# Patient Record
Sex: Male | Born: 1945 | Race: Black or African American | Hispanic: No | Marital: Married
Health system: Southern US, Academic
[De-identification: ages and names within clinical notes are randomized; demographics above are authoritative.]

---

## 2000-03-28 ENCOUNTER — Other Ambulatory Visit (HOSPITAL_COMMUNITY): Payer: Self-pay

## 2023-03-05 ENCOUNTER — Encounter (HOSPITAL_COMMUNITY): Payer: Self-pay | Admitting: Family

## 2023-03-05 ENCOUNTER — Emergency Department (HOSPITAL_COMMUNITY): Payer: Medicare Other

## 2023-03-05 ENCOUNTER — Emergency Department
Admission: EM | Admit: 2023-03-05 | Discharge: 2023-03-05 | Disposition: A | Discharge status: Home / Self Care | Payer: Medicare Other | Attending: Emergency Medicine | Admitting: Emergency Medicine

## 2023-03-05 ENCOUNTER — Other Ambulatory Visit: Payer: Self-pay

## 2023-03-05 DIAGNOSIS — X501XXA Overexertion from prolonged static or awkward postures, initial encounter: Secondary | ICD-10-CM | POA: Insufficient documentation

## 2023-03-05 DIAGNOSIS — M25559 Pain in unspecified hip: Secondary | ICD-10-CM

## 2023-03-05 DIAGNOSIS — M25552 Pain in left hip: Secondary | ICD-10-CM | POA: Insufficient documentation

## 2023-03-05 MED ORDER — KETOROLAC 30 MG/ML (1 ML) INJECTION SOLUTION
30.0000 mg | INTRAMUSCULAR | Status: AC
Start: 2023-03-05 — End: 2023-03-05
  Administered 2023-03-05: 30 mg via INTRAMUSCULAR

## 2023-03-05 MED ORDER — DEXAMETHASONE SODIUM PHOSPHATE (PF) 10 MG/ML INJECTION SOLUTION
10.0000 mg | INTRAMUSCULAR | Status: DC
Start: 2023-03-05 — End: 2023-03-05

## 2023-03-05 MED ORDER — CYCLOBENZAPRINE 10 MG TABLET
10.0000 mg | ORAL_TABLET | Freq: Three times a day (TID) | ORAL | 0 refills | Status: DC | PRN
Start: 2023-03-05 — End: 2023-03-05

## 2023-03-05 MED ORDER — ACETAMINOPHEN 500 MG TABLET
1000.0000 mg | ORAL_TABLET | Freq: Four times a day (QID) | ORAL | 0 refills | Status: AC | PRN
Start: 2023-03-05 — End: 2023-03-12

## 2023-03-05 MED ORDER — KETOROLAC 30 MG/ML (1 ML) INJECTION SOLUTION
INTRAMUSCULAR | Status: AC
Start: 2023-03-05 — End: 2023-03-05
  Filled 2023-03-05: qty 1

## 2023-03-05 MED ORDER — PREDNISONE 50 MG TABLET
50.0000 mg | ORAL_TABLET | Freq: Every day | ORAL | 0 refills | Status: AC
Start: 2023-03-05 — End: 2023-03-10

## 2023-03-05 MED ORDER — CYCLOBENZAPRINE 10 MG TABLET
10.0000 mg | ORAL_TABLET | ORAL | Status: DC
Start: 2023-03-05 — End: 2023-03-05

## 2023-03-05 MED ORDER — ACETAMINOPHEN 500 MG TABLET
1000.0000 mg | ORAL_TABLET | Freq: Four times a day (QID) | ORAL | Status: DC | PRN
Start: 2023-03-05 — End: 2023-03-05

## 2023-03-05 MED ORDER — PREDNISONE 50 MG TABLET
50.0000 mg | ORAL_TABLET | Freq: Every day | ORAL | 0 refills | Status: DC
Start: 2023-03-05 — End: 2023-03-05

## 2023-03-05 MED ORDER — CYCLOBENZAPRINE 10 MG TABLET
10.0000 mg | ORAL_TABLET | Freq: Three times a day (TID) | ORAL | 0 refills | Status: AC | PRN
Start: 2023-03-05 — End: ?

## 2023-03-05 NOTE — ED APP Handoff Note (Signed)
[facility]  Emergency Department  Provider in Triage Note    Name: Todd BORCHERS Sr.  Age: 77 y.o.  Gender: male     Subjective:   Todd RA Sr. is a 77 y.o. male who presents with complaint of Hip Pain  .  Patient states new onset of significant left-sided hip pain after turning the wrong way yesterday.  He denies falling or direct impact.  He has had no problems with this hip before.  Patient is able to walk he states that it just makes it worse.  Patient does not take blood thinners.    Objective:   Filed Vitals:    03/05/23 1448   BP: (!) 144/87   Pulse: (!) 105   Resp: 20   Temp: 36.7 C (98.1 F)   SpO2: 100%      Focused Physical Exam shows tenderness with palpation of the left hip.  Left femur has no tenderness to palpation in lumbar spine has no tenderness with palpation.    Plan:  Please see initial orders and work-up below.  This is to be continued with full evaluation in the main Emergency Department.     ketorolac (TORADOL) 30 mg/mL injection, 30 mg, IntraMUSCULAR, Now       No results found for this or any previous visit (from the past 24 hour(s)).     Lorrin Goodell, PA-C  03/05/2023, 14:47

## 2023-03-05 NOTE — ED Provider Notes (Signed)
[facility]  ED Primary Provider Note  History of Present Illness   Chief Complaint   Patient presents with    Hip Pain     Todd Horton Sr. is a 77 y.o. male who had concerns including Hip Pain.  Arrival: The patient arrived by Car    Patient is 77 year old male to the emergency department complaining of left hip pain.  Patient states he was turning and felt a sharp pain in his left hip that radiated into his groin.  Patient denies history of hip problems in the past.  Patient denies loss of bladder, bowel, saddle anesthesia, urinary bladder retention.      History Reviewed This Encounter: Medical History  Surgical History  Family History  Social History    Physical Exam   ED Triage Vitals [03/05/23 1448]   BP (Non-Invasive) (!) 144/87   Heart Rate (!) 105   Respiratory Rate 20   Temperature 36.7 C (98.1 F)   SpO2 100 %   Weight 79.4 kg (175 lb)   Height 1.829 m (6')     Physical Exam  Vitals and nursing note reviewed.   Constitutional:       General: He is not in acute distress.     Appearance: He is well-developed.   HENT:      Head: Normocephalic and atraumatic.   Eyes:      Conjunctiva/sclera: Conjunctivae normal.   Cardiovascular:      Rate and Rhythm: Normal rate and regular rhythm.      Heart sounds: No murmur heard.  Pulmonary:      Effort: Pulmonary effort is normal. No respiratory distress.      Breath sounds: Normal breath sounds.   Abdominal:      Palpations: Abdomen is soft.      Tenderness: There is no abdominal tenderness.   Musculoskeletal:         General: No swelling.      Cervical back: Neck supple.   Skin:     General: Skin is warm and dry.      Capillary Refill: Capillary refill takes less than 2 seconds.   Neurological:      Mental Status: He is alert.   Psychiatric:         Mood and Affect: Mood normal.       Patient Data   Labs Ordered/Reviewed - No data to display  XR HIP LEFT W PELVIS 2-3 VIEWS   Final Result by Edi, Radresults In (06/20 1510)   NO  ACUTE FRACTURE OR DISLOCATION.       If acute hip fracture is suspected after a fall or minor trauma and initial radiographs are negative then MRI of the pelvis and affected hip without IV contrast or CT of the pelvis and hips without IV contrast is usually appropriate as the next imaging study. (ACR Appropriateness Criteria: Acute Hip Pain-Suspected Fracture, 2018)                Radiologist location ID: [id number]           Medical Decision Making        Medical Decision Making  Patient is 77 year old male to the emergency department complaining of left hip pain.  On physical examination patient has pain along left SI joint.  Patient DTRs and cranial nerves 2-12 intact patient is ambulatory without limp.  Patient x-ray ordered for evaluation showing no acute abnormalities.  Patient medicated with Toradol and on re-evaluation  patient feels somewhat better.  Discussed CT of hip for further evaluation patient denied at this time and agrees there was no trauma to the hip.  No palpable deformities noted.  Patient is discharged with Tylenol, Decadron and Flexeril to use at home and to follow up with primary care or return to ED if worsening symptoms.    Risk  OTC drugs.  Prescription drug management.                Medications Administered in the ED   dexAMETHasone (PF) 10 mg/mL injection (has no administration in time range)   cyclobenzaprine (FLEXERIL) tablet (has no administration in time range)   ketorolac (TORADOL) 30 mg/mL injection (30 mg IntraMUSCULAR Given 03/05/23 1500)     Clinical Impression   Hip pain, unspecified laterality (Primary)       Disposition: Discharged

## 2023-03-05 NOTE — ED Triage Notes (Signed)
Left hip started yesterday after she turned . Pain into leg

## 2023-03-10 ENCOUNTER — Emergency Department (HOSPITAL_COMMUNITY): Payer: Medicare Other

## 2023-03-10 ENCOUNTER — Encounter (HOSPITAL_COMMUNITY): Payer: Self-pay | Admitting: Nurse Practitioner

## 2023-03-10 ENCOUNTER — Emergency Department
Admission: EM | Admit: 2023-03-10 | Discharge: 2023-03-10 | Disposition: A | Discharge status: Home / Self Care | Payer: Medicare Other | Attending: Nurse Practitioner | Admitting: Nurse Practitioner

## 2023-03-10 ENCOUNTER — Other Ambulatory Visit: Payer: Self-pay

## 2023-03-10 DIAGNOSIS — M1612 Unilateral primary osteoarthritis, left hip: Secondary | ICD-10-CM | POA: Insufficient documentation

## 2023-03-10 DIAGNOSIS — M25552 Pain in left hip: Secondary | ICD-10-CM

## 2023-03-10 MED ORDER — KETOROLAC 30 MG/ML (1 ML) INJECTION SOLUTION
30.0000 mg | INTRAMUSCULAR | Status: AC
Start: 2023-03-10 — End: 2023-03-10
  Administered 2023-03-10: 30 mg via INTRAMUSCULAR

## 2023-03-10 MED ORDER — KETOROLAC 30 MG/ML (1 ML) INJECTION SOLUTION
INTRAMUSCULAR | Status: AC
Start: 2023-03-10 — End: 2023-03-10
  Filled 2023-03-10: qty 1

## 2023-03-10 MED ORDER — MELOXICAM 7.5 MG TABLET
7.5000 mg | ORAL_TABLET | Freq: Every day | ORAL | 0 refills | Status: AC
Start: 2023-03-10 — End: 2023-03-17

## 2023-03-10 NOTE — ED Provider Notes (Signed)
[facility]  ED Primary Provider Note  History of Present Illness   Chief Complaint   Patient presents with    Hip Pain     Left side     Todd SHADOWENS Sr. is a 77 y.o. male who had concerns including Hip Pain.  Arrival: The patient arrived by Car      This 77 year old male presents to the emergency department complaining of left hip pain that started 6 days ago when he turned around suddenly and felt his left hip pop.  He denies falling.  He denies any other injuries.  He denies numbness or tingling.  He denies fever.      History provided by:  Patient    History Reviewed This Encounter: Medical History  Surgical History  Family History  Social History    Physical Exam   ED Triage Vitals [03/10/23 1817]   BP (Non-Invasive) (!) 150/83   Heart Rate (!) 103   Respiratory Rate 18   Temperature 36.9 C (98.5 F)   SpO2 100 %   Weight 78 kg (172 lb)   Height 1.829 m (6')     Physical Exam  Vitals and nursing note reviewed.   Constitutional:       General: He is not in acute distress.     Appearance: Normal appearance. He is normal weight. He is not ill-appearing, toxic-appearing or diaphoretic.   HENT:      Head: Normocephalic and atraumatic.      Right Ear: External ear normal.      Left Ear: External ear normal.      Mouth/Throat:      Mouth: Mucous membranes are moist.      Pharynx: Oropharynx is clear.   Eyes:      Conjunctiva/sclera: Conjunctivae normal.      Pupils: Pupils are equal, round, and reactive to light.   Cardiovascular:      Rate and Rhythm: Normal rate and regular rhythm.      Pulses: Normal pulses.      Heart sounds: Normal heart sounds.   Pulmonary:      Effort: Pulmonary effort is normal. No respiratory distress.      Breath sounds: Normal breath sounds. No stridor. No wheezing, rhonchi or rales.   Abdominal:      General: Abdomen is flat. Bowel sounds are normal. There is no distension.      Palpations: Abdomen is soft.      Tenderness: There is no abdominal  tenderness.   Musculoskeletal:         General: Tenderness (Left lateral hip) present. No swelling, deformity or signs of injury. Normal range of motion.      Cervical back: Normal range of motion and neck supple. No rigidity or tenderness.      Comments: Painful range of motion of the left hip   Skin:     General: Skin is warm and dry.      Capillary Refill: Capillary refill takes less than 2 seconds.      Coloration: Skin is not jaundiced or pale.      Findings: No bruising or erythema.   Neurological:      General: No focal deficit present.      Mental Status: He is alert and oriented to person, place, and time.      Cranial Nerves: No cranial nerve deficit.      Sensory: No sensory deficit.   Psychiatric:  Mood and Affect: Mood normal.         Behavior: Behavior normal.       Patient Data     Labs Ordered/Reviewed - No data to display  CT HIP LEFT WO IV CONTRAST   Final Result by Edi, Radresults In (06/25 2007)   MILD DEGENERATIVE CHANGES. NO EVIDENCE OF HIP FRACTURE.         One or more dose reduction techniques were used (e.g., Automated exposure control, adjustment of the mA and/or kV according to patient size, use of iterative reconstruction technique).         Radiologist location ID: [id number]           Medical Decision Making          Medical Decision Making  This 77 year old male presents to the emergency department complaining of left hip pain that started 6 days ago.  He states that he turned around quickly and felt his left hip pop.  He denies falling or any specific injury.  Physical exam revealed mild tenderness to the left lateral hip.  He had an x-ray here previously which showed no acute abnormality.  Today a CT of the left hip was ordered which showed arthritis with no acute abnormality.  The patient is most likely suffering from arthralgia of the left hip.  He was treated here in the emergency department with Toradol 30 mg IM.  He will be discharged and treated on outpatient basis  with Mobic once daily for 7 days.  He was advised to follow-up with his primary care provider in the orthopedic doctor in 1-3 days and/or return to the emergency department if worse or as needed.    Problems Addressed:  Arthralgia of left hip: acute illness or injury    Risk  Prescription drug management.      ED Course as of 03/10/23 2022   Tue Mar 10, 2023   2010 CT of the left hip without IV contrast shows mild degenerative changes with no acute fracture.   2013 Alert x3 and in no acute distress.  The diagnosis and treatment plan was explained to the patient verbalized understanding of the discharge instructions.            Medications Administered in the ED   ketorolac (TORADOL) 30 mg/mL injection (30 mg IntraMUSCULAR Given 03/10/23 1900)     Clinical Impression   Arthralgia of left hip (Primary)       Disposition: Discharged

## 2023-03-10 NOTE — Discharge Instructions (Signed)
Thank you for allowing [place] to be part of your care.  Hip exercises as tolerated.  Use crutches while upright until you are comfortable without them.  Take medications as directed.  Follow-up with your primary care provider and the orthopedic doctor in 1-3 days by calling their office within the next 24 hours to arrange an appointment.  Return to the emergency department if worse or as needed.  We hope you feel better.

## 2023-03-10 NOTE — ED Nurses Note (Signed)
D/C instructions reviewed with patient.  He verbalized understanding.  Prescription given.  Pt left for home with s.o., ambulating on crutches.

## 2023-03-10 NOTE — ED APP Handoff Note (Signed)
 [facility]  Emergency Department  Provider in Triage Note    Name: Todd SHEDD Sr.  Age: 77 y.o.  Gender: male     Subjective:   Todd SCHIAVO Sr. is a 77 y.o. male who presents with complaint of Hip Pain (Left side)  .  Patient reports turning and feeling a pop in his left hip last week.  He states that since then he has been having pain which radiates to the left upper leg.  He rates the pain as 50/10.  He denies any fevers or chills.    Objective:   Filed Vitals:    03/10/23 1817   BP: (!) 150/83   Pulse: (!) 103   Resp: 18   Temp: 36.9 C (98.5 F)   SpO2: 100%      Focused Physical Exam shows patient in no acute distress.  I do not appreciate any rash consistent with shingles.  Patient is able to speak in clear sentences and appropriate words.Patient is neurovascularly intact of the distal left lower extremity.    Plan:  Please see initial orders and work-up below.  This is to be continued with full evaluation in the main Emergency Department.     ketorolac (TORADOL) 30 mg/mL injection, 30 mg, IntraMUSCULAR, Now       No results found for this or any previous visit (from the past 24 hour(s)).     Lorrin Goodell, PA-C  03/10/2023, 18:10

## 2023-03-10 NOTE — ED Triage Notes (Signed)
Patient states " I turned and felt a pop in my hip."Patient states this happened last week and he was seen here for this once before this week.

## 2023-03-17 IMAGING — MR MRI LUMBAR SPINE WITHOUT CONTRAST
5 of 6 series · 32 of 48 positions shown · IV contrast (gadolinium)
Comparison: None available.

﻿EXAM:  25076   MRI LUMBAR SPINE WITHOUT CONTRAST
INDICATION: Low back pain.
TECHNIQUE: Multiplanar multisequential MRI of the lumbar spine was performed without gadolinium contrast.

[Series 5: T2 · sagittal · 4.5mm · 0.94mm/px · 6 of 13 slices shown (1 of 3)]
[im 1/13]
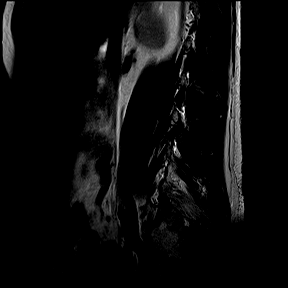
[im 3/13]
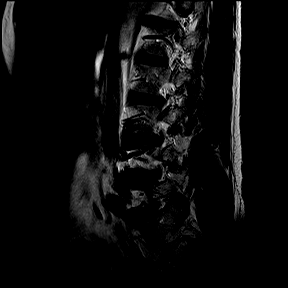
[im 5/13]
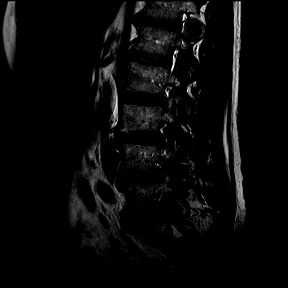
[im 8/13]
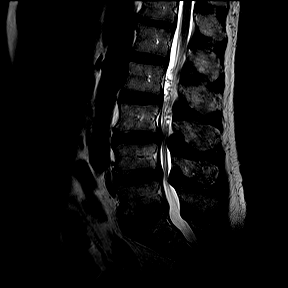
[im 10/13]
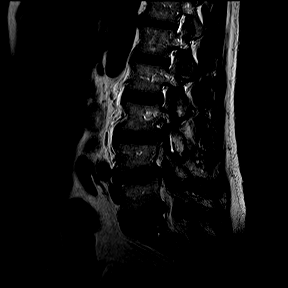
[im 13/13]
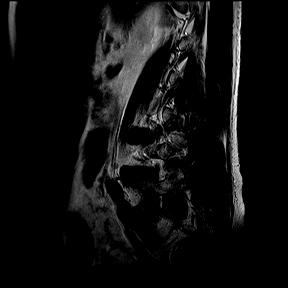

[Series 6: T1 · sagittal · 4.5mm · 0.94mm/px · 6 of 13 slices shown (1 of 2)]
[im 1/13]
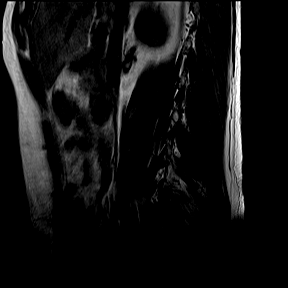
[im 3/13]
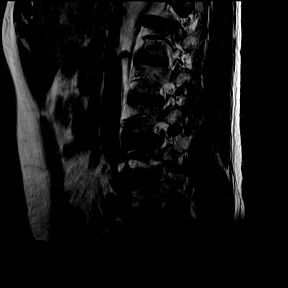
[im 5/13]
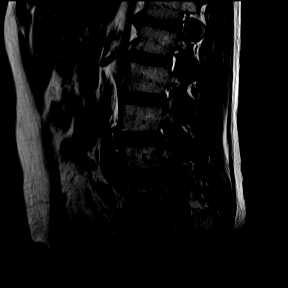
[im 8/13]
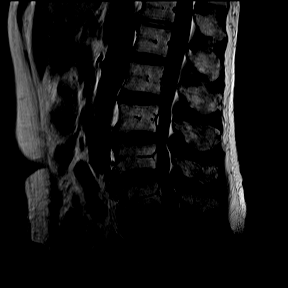
[im 10/13]
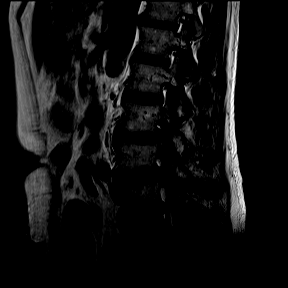
[im 13/13]
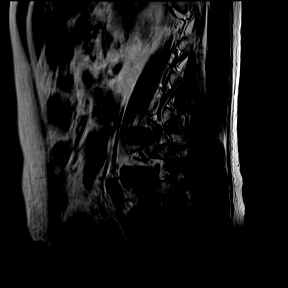

[Series 8: T2 · coronal · 5.0mm · 0.82mm/px · 9 of 18 slices shown (2 of 3)]
[im 1/18]
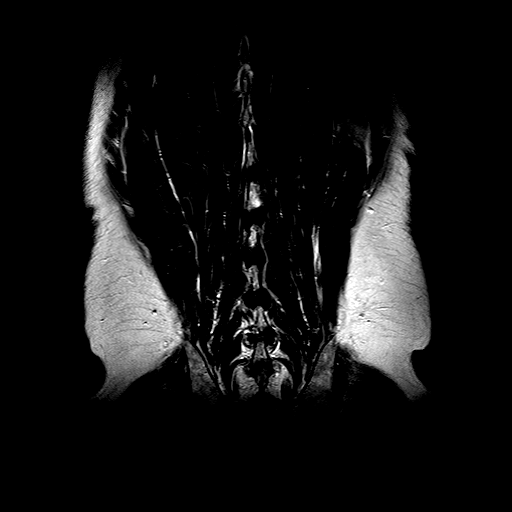
[im 3/18]
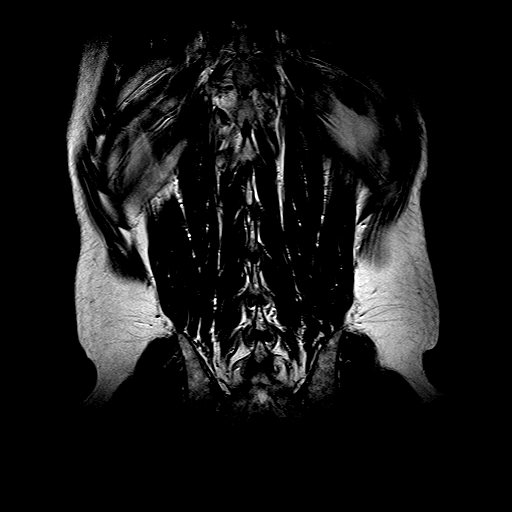
[im 5/18]
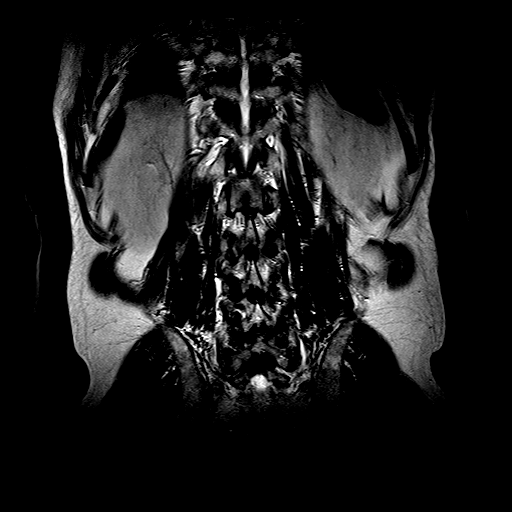
[im 7/18]
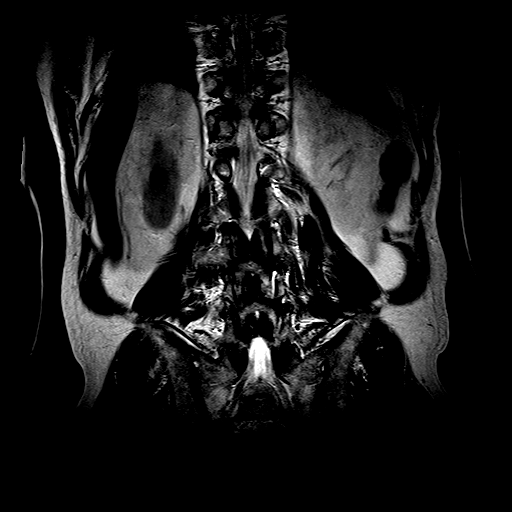
[im 9/18]
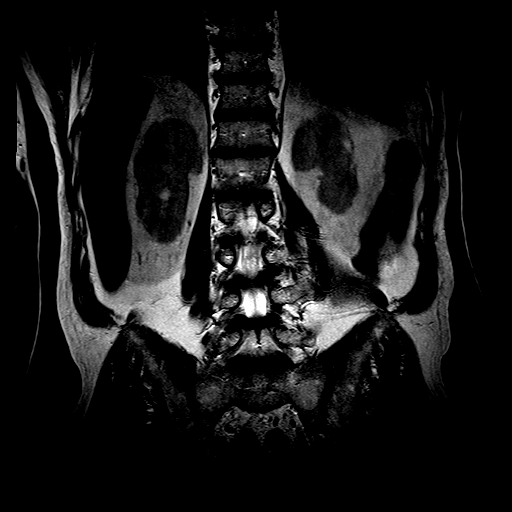
[im 11/18]
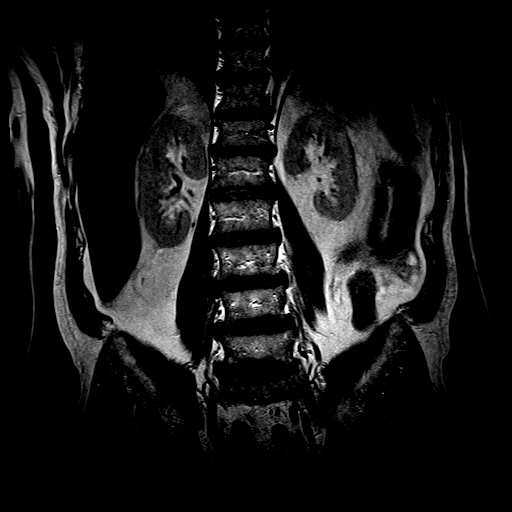
[im 13/18]
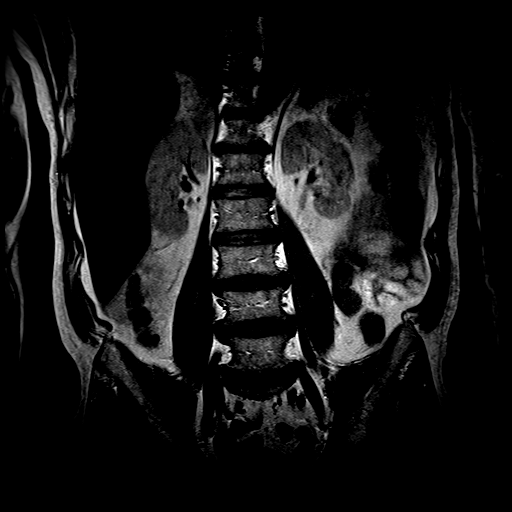
[im 15/18]
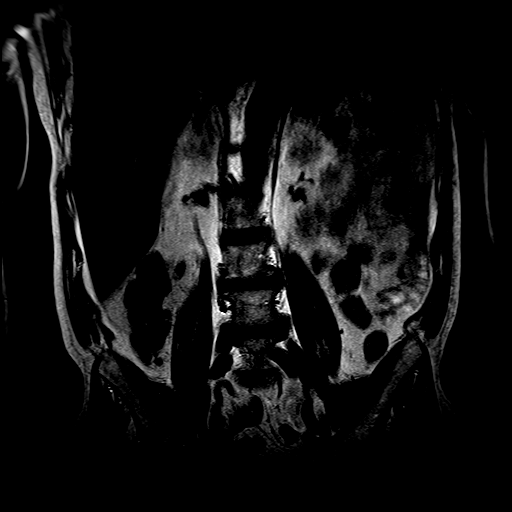
[im 18/18]
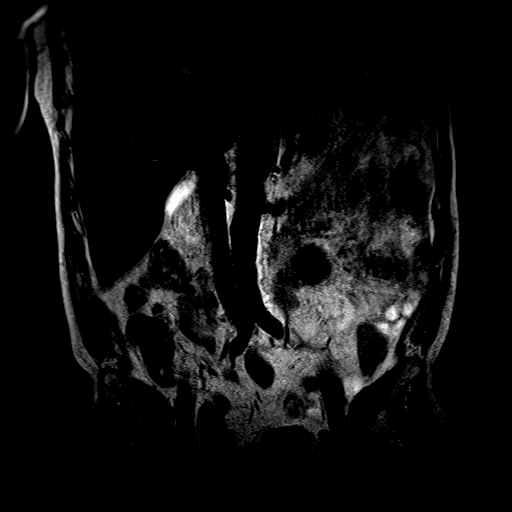

[Series 9: T2 · axial · 4.0mm · 0.52mm/px · z∈[-197,+35]mm · 8 of 19 slices shown (3 of 3)]
[im 1/19]
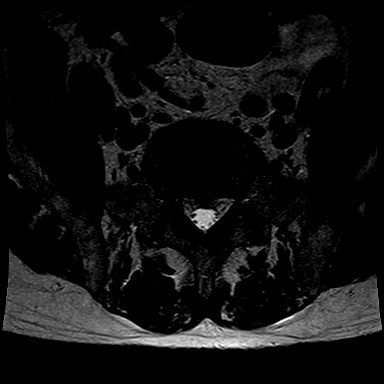
[im 3/19]
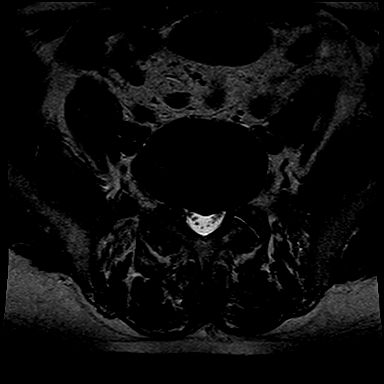
[im 7/19]
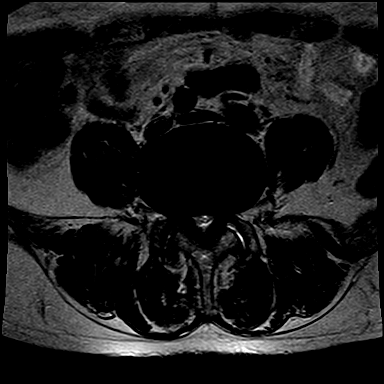
[im 9/19]
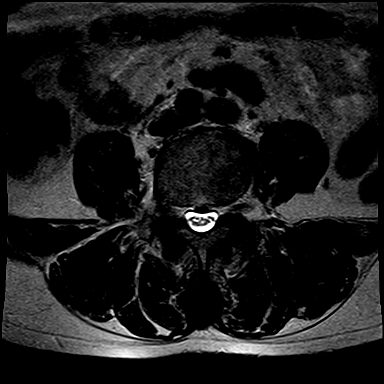
[im 11/19]
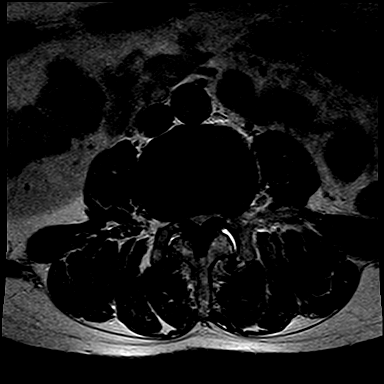
[im 13/19]
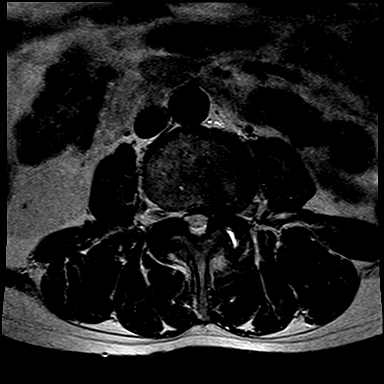
[im 17/19]
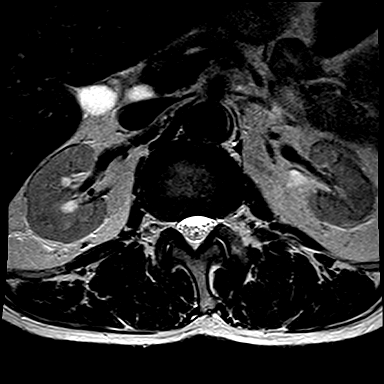
[im 19/19]
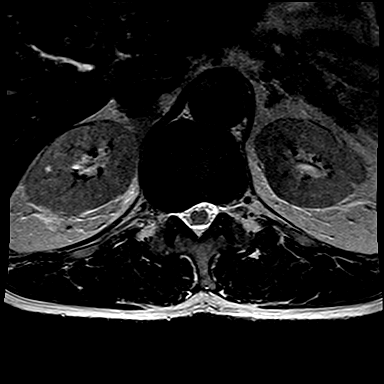

[Series 10: T1 · axial · 4.0mm · 0.52mm/px · z∈[-197,-134]mm · 3 of 19 slices shown (2 of 2)]
[im 1/19]
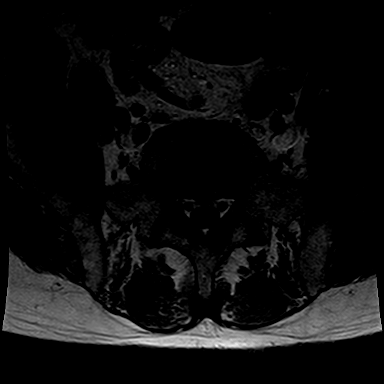
[im 3/19]
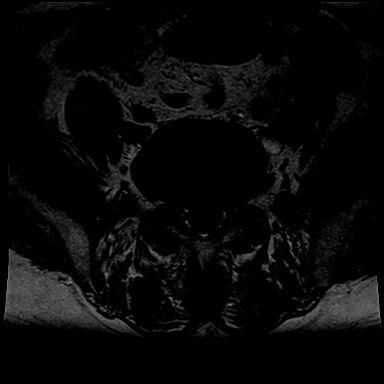
[im 7/19]
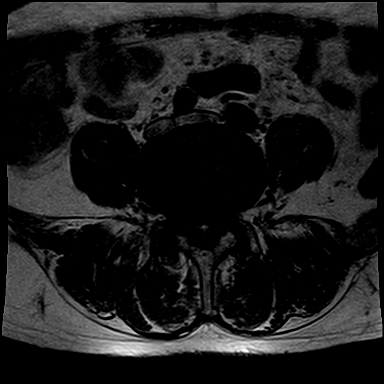

[32 of 48 positions shown; findings below may reference images not displayed]

FINDINGS: There are mild to moderate degenerative changes at multiple levels.  There is no fracture, malalignment, or significant marrow signal alteration.  

At T12-L1 there is mild right sided disc bulging with slight compression of the right side of the thecal sac.  

L1-2 appears unremarkable.  

At L2-3 there is mild spinal stenosis.  

At L3-4 there is severe spinal stenosis produced by a combination of moderate disc bulging and hypertrophic changes of the facet joints and ligamentum flavum.  There is also a left sided L3-4 disc herniation with possible compression of the left L3 nerve root.  

At L4-5 there is severe spinal stenosis.  

At L5-S1 there is a small central disc herniation with slight compression of the thecal sac and possible compression of the S1 nerve roots.
IMPRESSION: 1. Severe L3-4 and L4-5 spinal stenosis.  

2. Left sided L3-4 disc herniation.  

3. Central L5-S1 disc herniation.

## 2023-12-28 ENCOUNTER — Other Ambulatory Visit: Payer: Self-pay

## 2023-12-28 ENCOUNTER — Emergency Department
Admission: EM | Admit: 2023-12-28 | Discharge: 2023-12-29 | Disposition: A | Discharge status: Home / Self Care | Payer: Auto Insurance (includes no fault) | Attending: Emergency Medicine | Admitting: Emergency Medicine

## 2023-12-28 ENCOUNTER — Emergency Department (HOSPITAL_COMMUNITY): Payer: Auto Insurance (includes no fault)

## 2023-12-28 DIAGNOSIS — Y92410 Unspecified street and highway as the place of occurrence of the external cause: Secondary | ICD-10-CM | POA: Insufficient documentation

## 2023-12-28 DIAGNOSIS — S8002XA Contusion of left knee, initial encounter: Secondary | ICD-10-CM | POA: Insufficient documentation
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
## 2023-12-29 ENCOUNTER — Emergency Department (HOSPITAL_COMMUNITY): Payer: Auto Insurance (includes no fault)

## 2023-12-29 NOTE — ED Nurses Note (Signed)
 Pt ambulated out of ED with discharge instructions and belongings. Pt stated that the pain in his knee has subsided.
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
## 2023-12-29 NOTE — ED Nurses Note (Signed)
 Pt states he has a history of back pain and left sided sciatica.

## 2024-01-04 ENCOUNTER — Other Ambulatory Visit: Payer: Self-pay

## 2024-01-05 ENCOUNTER — Ambulatory Visit (HOSPITAL_COMMUNITY)
Admission: RE | Admit: 2024-01-05 | Discharge: 2024-01-05 | Disposition: A | Discharge status: Still In Hospital | Source: Ambulatory Visit

## 2024-01-05 ENCOUNTER — Encounter (HOSPITAL_COMMUNITY): Payer: Self-pay

## 2024-01-05 ENCOUNTER — Other Ambulatory Visit (HOSPITAL_COMMUNITY): Payer: Self-pay

## 2024-01-05 DIAGNOSIS — M5416 Radiculopathy, lumbar region: Secondary | ICD-10-CM | POA: Insufficient documentation
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
## 2024-01-13 ENCOUNTER — Other Ambulatory Visit: Payer: Self-pay

## 2024-01-13 ENCOUNTER — Ambulatory Visit
Admission: RE | Admit: 2024-01-13 | Discharge: 2024-01-13 | Disposition: A | Discharge status: Still In Hospital | Payer: Self-pay | Source: Ambulatory Visit

## 2024-01-13 NOTE — PT Treatment (Signed)
 [facility] Medicine [facility]  Outpatient Physical Therapy  [address]  [place], [address]  [phone]  (Fax) [phone]    Physical Therapy Treatment Note    Date: 01/13/2024  Patient's Name: Todd Horton.  Date of Birth: Jun 07, 1946  Physical Therapy Visit            Visit #/POC: 2 of 12  Authorization: 2 of 12  POC Signed?:   POC Ends: 02/16/24  Order Ends: 02/16/24  Next Progress Note Due: 8-10th visit      Evaluating Physical Therapist: Sheryl Donna, PT, DPT  PT diagnosis/Reason for Referral: lumbar radiculopathy  Next Scheduled Physician Appointment: 03/15/24  Allergies/Contraindications: n/a          Subjective: Reports no new complaints today.    Objective: Warm up on Nustep followed by there ex per flow sheet for strengthening and stretching as noted below    Measured ROM no measurements taken (01/13/2024):   EXERCISE/ACTIVITY NAME REPETITIONS RESISTANCE COMPLETED THIS DOS   Nustep   5 minutes L2 Y   Hooklying hip abd L/R   10 each green Y   Manual HS stretch   60 seconds  Y   SKC with ball   10  Y   LTR with ball   10  Y   Hip flexor stretch   60 seconds x 2  Y                             DISCONTINUED ACTIVITIES                                    Assessment: Good tolerance for strengthening and stretching exercises with no adverse effects.  Short-Term Goals: 3 Weeks     - Patient will demonstrate improved lumbar AROM to at least 15 trunk extension to aid in completion of ADLs.     - Patient will be independent with progressive HEP to maximize gains from PT.     - Patient will report max 7 LBP to aid in participation in PT.     - Patient will report reduced radicular symptoms by 40% to aid in house ADLs.     - Patient will exhibit functional trunk mobility and strength to allow non-limiting bed mobility activities due to pain.            Long-Term Goals: 6 Weeks     - Patient will demonstrate improved B side lying hip abduction strength of at least 3+/5 to aid in functional  transfers.     - Patient will demonstrate 10 squats/sit <> stands with 10# to aid in independent completion of ADLs/IADLs.     - Patient will demonstrate improved functional ability via improved Patient Specific Functional Score to at least 6.     - Patient will report abolishment of radicular symptoms with worst subjective pain <=5/10 to allow return to community ambulation not limited by pain.      Plan: Continue and progress per PT POC    Total Session Time 38 and Timed code minutes 38  THERAPEUTIC EXERCISE 38 minutes      Fadel Clason, PTA  01/13/2024, 12:51

## 2024-01-15 ENCOUNTER — Ambulatory Visit (HOSPITAL_COMMUNITY)
Admission: RE | Admit: 2024-01-15 | Discharge: 2024-01-15 | Disposition: A | Discharge status: Still In Hospital | Payer: Self-pay | Source: Ambulatory Visit

## 2024-01-15 ENCOUNTER — Other Ambulatory Visit: Payer: Self-pay

## 2024-01-15 DIAGNOSIS — M5416 Radiculopathy, lumbar region: Secondary | ICD-10-CM | POA: Insufficient documentation

## 2024-01-15 NOTE — PT Treatment (Signed)
 [facility]  Outpatient Physical Therapy  [address]  [place], [address]  [phone]  (Fax) [phone]    Physical Therapy Treatment Note    Date: 01/15/2024  Patient's Name: Todd TRAYNHAM Sr.  Date of Birth: [birth year]  Physical Therapy Visit        Visit #/POC: 3 of 12  Authorization: 3 of 12  POC Signed?:   POC Ends: 02/16/24  Order Ends: 02/16/24  Next Progress Note Due: 8-10th visit        Evaluating Physical Therapist: Sheryl Donna, PT, DPT  PT diagnosis/Reason for Referral: lumbar radiculopathy  Next Scheduled Physician Appointment: 03/15/24  Allergies/Contraindications: n/a              Subjective: Patient reports some soreness after last session's exercise.  Reports 2-3/10 LBP today.     Objective: Warm up on Nustep followed by there ex per flow sheet for strengthening and stretching as noted below     Measured ROM no measurements taken (01/13/2024):   EXERCISE/ACTIVITY NAME REPETITIONS RESISTANCE COMPLETED THIS DOS   Nustep    5 minutes L2 Y   Hooklying hip abd L/R   Hook lying march 12 each  12 each [name] Y  Y   Manual HS stretch    60 seconds   Y   SKC with ball    10   Y   LTR with ball    10   Y   Hip flexor stretch   Figure-4 stretch 60 seconds x 2  2x30"   Y  Y    Seated hip IR    20 Yellow Y    Back extension machine    12 30# Y    Hip abduction machine    15 40# Y   Leg press machine 15 70# Y   MHP 10 minutes Lumbar spine Y      DISCONTINUED ACTIVITIES                                                         Assessment: Progressed to machine strengthening with fair response.  Does exhibit functional weakness as evidenced through resistance used on leg press being challenging.  Cueing provided during ambulation for upright posture as he walks with forward flexed trunk.      Short-Term Goals: 3 Weeks     - Patient will demonstrate improved lumbar AROM to at least 15 trunk extension to aid in completion of ADLs.     - Patient will be independent with  progressive HEP to maximize gains from PT.     - Patient will report max 7 LBP to aid in participation in PT.     - Patient will report reduced radicular symptoms by 40% to aid in house ADLs.     - Patient will exhibit functional trunk mobility and strength to allow non-limiting bed mobility activities due to pain.            Long-Term Goals: 6 Weeks     - Patient will demonstrate improved B side lying hip abduction strength of at least 3+/5 to aid in functional transfers.     - Patient will demonstrate 10 squats/sit <> stands with 10# to aid in independent completion of ADLs/IADLs.     -  Patient will demonstrate improved functional ability via improved Patient Specific Functional Score to at least 6.     - Patient will report abolishment of radicular symptoms with worst subjective pain <=5/10 to allow return to community ambulation not limited by pain.      Plan: Continue and progress per PT POC    Total Session Time 45, Timed code minutes 35, and Untimed code minutes 10  THERAPEUTIC EXERCISE 35 minutes, MHP 10 minutes      Todd Horton, PT  01/15/2024, 13:45

## 2024-01-18 ENCOUNTER — Ambulatory Visit (HOSPITAL_COMMUNITY)
Admission: RE | Admit: 2024-01-18 | Discharge: 2024-01-18 | Disposition: A | Discharge status: Still In Hospital | Payer: Self-pay | Source: Ambulatory Visit

## 2024-01-18 DIAGNOSIS — M5416 Radiculopathy, lumbar region: Secondary | ICD-10-CM

## 2024-01-18 NOTE — PT Treatment (Signed)
 [facility]  Outpatient Physical Therapy  [address]  [place], [address]  [phone]  (Fax) [phone]    Physical Therapy Treatment Note    Date: 01/18/2024  Patient's Name: Todd LONE Sr.  Date of Birth: [birth year]  Physical Therapy Visit        Visit #/POC: 4 of 12  Authorization: 4 of 12  POC Signed?:   POC Ends: 02/16/24  Order Ends: 02/16/24  Next Progress Note Due: 8-10th visit        Evaluating Physical Therapist: Sheryl Donna, PT, DPT  PT diagnosis/Reason for Referral: lumbar radiculopathy  Next Scheduled Physician Appointment: 03/15/24  Allergies/Contraindications: n/a             Subjective:  Pt reports doing well.  Notes he was sore after last session but it finally resolved.  Reports he was able to go fishing this weekend.  Rates pain today 0/10.  Pt notes he can stand longer and walk further but still limited.      Objective:  Activities as noted below.     Measured ROM     EXERCISE/ACTIVITY NAME REPETITIONS RESISTANCE COMPLETED THIS DOS   Nustep    5 minutes L4 Y   Hooklying hip abd L/R   Hook lying march 12 each  12 each Todd Horton   Manual HS stretch    60 seconds   Y   SKC with ball    10   Y   LTR with ball    10   Y   Hip flexor stretch   Figure-4 stretch 60 seconds x 2  2x30"   Y  Y    Seated hip IR    20 Yellow Y    Back extension machine    12 30# Y    Hip abduction machine    15 40# n   Leg press machine 15 70# n   MHP 10 minutes Lumbar spine n      DISCONTINUED ACTIVITIES                                                        Assessment:  Pt tolerated treatment well.  Still exhibits some limitation in mobility especially bilateral hips.  Pt reports subjective improvement but no full resolution of symptoms     hort-Term Goals: 3 Weeks     - Patient will demonstrate improved lumbar AROM to at least 15 trunk extension to aid in completion of ADLs.     - Patient will be independent with progressive HEP to maximize gains from PT.     - Patient  will report max 7 LBP to aid in participation in PT.     - Patient will report reduced radicular symptoms by 40% to aid in house ADLs.     - Patient will exhibit functional trunk mobility and strength to allow non-limiting bed mobility activities due to pain.            Long-Term Goals: 6 Weeks     - Patient will demonstrate improved B side lying hip abduction strength of at least 3+/5 to aid in functional transfers.     - Patient will demonstrate 10 squats/sit <> stands with 10# to aid in independent completion of ADLs/IADLs.     -  Patient will demonstrate improved functional ability via improved Patient Specific Functional Score to at least 6.     - Patient will report abolishment of radicular symptoms with worst subjective pain <=5/10 to allow return to community ambulation not limited by pain.            Plan:  Will continue and progress as tolerated     Total Session Time 33 and Timed code minutes 33  THERAPEUTIC EXERCISE 33 minutes      [name], PTA  01/18/2024, 11:48

## 2024-01-20 ENCOUNTER — Ambulatory Visit (HOSPITAL_COMMUNITY)
Admission: RE | Admit: 2024-01-20 | Discharge: 2024-01-20 | Disposition: A | Discharge status: Still In Hospital | Payer: Self-pay | Source: Ambulatory Visit

## 2024-01-20 DIAGNOSIS — M5416 Radiculopathy, lumbar region: Secondary | ICD-10-CM

## 2024-01-20 NOTE — PT Treatment (Signed)
 [facility]  Outpatient Physical Therapy  [address]  [place], [address]  [phone]  (Fax) [phone]    Physical Therapy Treatment Note    Date: 01/20/2024  Patient's Name: Todd OLBRICH Sr.  Date of Birth: [birth year]  Physical Therapy Visit          Visit #/POC: 5 of 12  Authorization: 5 of 12  POC Signed?:   POC Ends: 02/16/24  Order Ends: 02/16/24  Next Progress Note Due: 8-10th visit        Evaluating Physical Therapist: Sheryl Donna, PT, DPT  PT diagnosis/Reason for Referral: lumbar radiculopathy  Next Scheduled Physician Appointment: 03/15/24  Allergies/Contraindications: n/a                Subjective:  Pt reports doing well today. Notes some pain and rates 2-3/10 very low back and hips.  States he is doing exercise at home as advised.  Reports he does feel like therapy is helping.     Objective:  Activities as noted below.         EXERCISE/ACTIVITY NAME REPETITIONS RESISTANCE COMPLETED THIS DOS   Nustep    5 minutes L4 Y   Hooklying hip abd L/R   Hook lying march 12 each  12 each [employer]al[employer]   Manual HS stretch    60 seconds   Y   SKC with ball    10   Y   LTR with ball    10   Y   Hip flexor stretch   Figure-4 stretch 60 seconds x 2  2x30"   Y  Y    Seated hip IR    20 Yellow Y    Back extension machine     2 x 10 30# Y    Hip abduction machine    15 40# n   Leg press machine 15 70# n   MHP 10 minutes Lumbar spine n      DISCONTINUED ACTIVITIES                                                        Assessment: Pt tolerated all treatment well.  He does continue to exhibit some weakness and some limited mobility.      hort-Term Goals: 3 Weeks     - Patient will demonstrate improved lumbar AROM to at least 15 trunk extension to aid in completion of ADLs.     - Patient will be independent with progressive HEP to maximize gains from PT.     - Patient will report max 7 LBP to aid in participation in PT.     - Patient will report reduced radicular symptoms by 40% to  aid in house ADLs.     - Patient will exhibit functional trunk mobility and strength to allow non-limiting bed mobility activities due to pain.         Long-Term Goals: 6 Weeks     - Patient will demonstrate improved B side lying hip abduction strength of at least 3+/5 to aid in functional transfers.     - Patient will demonstrate 10 squats/sit <> stands with 10# to aid in independent completion of ADLs/IADLs.     - Patient will demonstrate improved functional ability via improved Patient Specific Functional  Score to at least 6.     - Patient will report abolishment of radicular symptoms with worst subjective pain <=5/10 to allow return to community ambulation not limited by pain.       Plan:  Will continue and progress as tolerated     Total Session Time 30 and Timed code minutes 30  THERAPEUTIC EXERCISE 30 minutes      Todd Horton, PTA  01/20/2024, 11:48

## 2024-01-25 ENCOUNTER — Ambulatory Visit (HOSPITAL_COMMUNITY)
Admission: RE | Admit: 2024-01-25 | Discharge: 2024-01-25 | Disposition: A | Discharge status: Still In Hospital | Payer: Self-pay | Source: Ambulatory Visit

## 2024-01-25 DIAGNOSIS — M5416 Radiculopathy, lumbar region: Secondary | ICD-10-CM

## 2024-01-25 NOTE — PT Treatment (Signed)
 [facility] Medicine [facility]  Outpatient Physical Therapy  [address]  [place], [address]  [phone]  (Fax) [phone]    Physical Therapy Treatment Note    Date: 01/25/2024  Patient's Name: Todd WEHRI Sr.  Date of Birth: [birth year]  Physical Therapy Visit        Visit #/POC: 6 of 12  Authorization: 6 of 12  POC Signed?:   POC Ends: 02/16/24  Order Ends: 02/16/24  Next Progress Note Due: 8-10th visit        Evaluating Physical Therapist: Sheryl Donna, PT, DPT  PT diagnosis/Reason for Referral: lumbar radiculopathy  Next Scheduled Physician Appointment: 03/15/24  Allergies/Contraindications: n/a       Subjective:  Pt reports he is doing better.  States he can walk and stand for longer.  However still notes soreness in low back and into both buttock.  Worst pain 7/10 over past week.  States pain is intermittent and variable.      Objective:  Activities as noted below.      Patient-Specific Functional Score:     Problem Score  01/25/24   1. House ADLs 0  5   2. Standing to finish 0  4   3. Stooping down 0  9   Total 0   6   Total score = sum of the activity scores/number of activities    Minimal detectable change (90% CI) for avg score = 2 points    Minimal detectable change (90% CI) for single activity score = 3 points            EXERCISE/ACTIVITY NAME REPETITIONS RESISTANCE COMPLETED THIS DOS   Nustep    7 minutes L4 Y   Hooklying hip abd L/R   Hook lying march 12 each  12 each Green  Green N  N    Manual HS stretch    60 seconds   n   SKC with ball    10   N    LTR with ball    10   n   Hip flexor stretch     Figure-4 stretch     Standing trunk extension 60 seconds x 2    2x30"     10 x 5 sec   at step     Seated  Y  Y    y    Seated hip IR    20 Yellow Y    Back extension machine     2 x 10 30# Y    Hip abduction machine    15 40# y   Leg press machine 15 70# n   MHP 10 minutes Lumbar spine n      DISCONTINUED ACTIVITIES                                                        Assessment:  Pt tolerated fair.  He did well until use of hip abduction machine.  States some low back pain with that.  Pt does exhibit significant limitation in bilateral hip rotation with stretches.  Some hip flexor tightness appreciated with stretches as well.  Pt is progressing towards goals set for therapy    hort-Term Goals: 3 Weeks     - Patient will demonstrate improved lumbar AROM to at least 15 trunk  extension to aid in completion of ADLs.     - Patient will be independent with progressive HEP to maximize gains from PT. (MET 01/25/24)    - Patient will report max 7 LBP to aid in participation in PT. (MET 01/25/24    - Patient will report reduced radicular symptoms by 40% to aid in house ADLs. (Progressing 01/25/24)    - Patient will exhibit functional trunk mobility and strength to allow non-limiting bed mobility activities due to pain.         Long-Term Goals: 6 Weeks     - Patient will demonstrate improved B side lying hip abduction strength of at least 3+/5 to aid in functional transfers.     - Patient will demonstrate 10 squats/sit <> stands with 10# to aid in independent completion of ADLs/IADLs.     - Patient will demonstrate improved functional ability via improved Patient Specific Functional Score to at least 6.     - Patient will report abolishment of radicular symptoms with worst subjective pain <=5/10 to allow return to community ambulation not limited by pain.         Plan: will continue and progress as tolerated     Total Session Time 30 and Timed code minutes 30  THERAPEUTIC EXERCISE 30 minutes      Cedrik Heindl, PTA  01/25/2024, 11:47

## 2024-01-27 ENCOUNTER — Other Ambulatory Visit: Payer: Self-pay

## 2024-01-27 ENCOUNTER — Ambulatory Visit (HOSPITAL_COMMUNITY)
Admission: RE | Admit: 2024-01-27 | Discharge: 2024-01-27 | Disposition: A | Discharge status: Still In Hospital | Payer: Self-pay | Source: Ambulatory Visit

## 2024-01-27 NOTE — PT Treatment (Signed)
 [facility] Medicine [facility]  Outpatient Physical Therapy  [address]  [place], [address]  [phone]  (Fax) [phone]    Physical Therapy Treatment Note    Date: 01/27/2024  Patient's Name: Todd VANWAGONER Sr.  Date of Birth: 1946/08/12  Physical Therapy Visit    Visit #/POC: 7 of 12  Authorization: 7 of 12  POC Signed?:   POC Ends: 02/16/24  Order Ends: 02/16/24  Next Progress Note Due: 8-10th visit        Evaluating Physical Therapist: Sheryl Donna, PT, DPT  PT diagnosis/Reason for Referral: lumbar radiculopathy  Next Scheduled Physician Appointment: 03/15/24  Allergies/Contraindications: n/a        Subjective:  Patient reports bilateral low back and hamstring pain today at 4/10, both sides about the same in intensity.  States he is doing better since starting therapy.     Objective:  Activities as noted below.       Patient-Specific Functional Score:     Problem Score  01/25/24   1. House ADLs 0  5   2. Standing to finish 0  4   3. Stooping down 0  9   Total 0   6   Total score = sum of the activity scores/number of activities    Minimal detectable change (90% CI) for avg score = 2 points    Minimal detectable change (90% CI) for single activity score = 3 points              EXERCISE/ACTIVITY NAME REPETITIONS RESISTANCE COMPLETED THIS DOS   Nustep    7 minutes L4 Y   Hooklying hip abd L/R   Hook lying march 12 each  12 each Green  Green N  N    Manual HS stretch    60 seconds   n   SKC with ball    10   N    LTR with ball    10   n   Bridges in hip abduction   15 Green Y   Side lying clamshell (B)   2x15 each Green Y   Side lying hip abduction (B) 10 Green Y   Supine hamstring stretch (B)  Supine hamstring mobilization via popliteal angle 2 x 30"  12 each Manual  AROM Y  Y   Hip flexor stretch      Figure-4 stretch      Standing trunk extension 60 seconds x 2     2x30"      10 x 5 sec   at step      Seated  n  n     N    Seated hip IR    20 Yellow Y    Back extension machine     2 x 12 40#  Y    Hip abduction machine    15 40# N   Side stepping   3 trips in // bar Green at ankles Y   Standing hip extension 10 each alternating Green at ankles Y   Leg press machine 15 70# n   MHP 10 minutes Lumbar spine n      DISCONTINUED ACTIVITIES  Assessment: Progressed therex in supine and FWB today with fair to poor response.  Related pain did increase at end of session, especially in the anterior thighs.  Patient's form in FWB exercises decreased rapidly with increased forward trunk lean posturing.     hort-Term Goals: 3 Weeks     - Patient will demonstrate improved lumbar AROM to at least 15 trunk extension to aid in completion of ADLs.     - Patient will be independent with progressive HEP to maximize gains from PT. (MET 01/25/24)    - Patient will report max 7 LBP to aid in participation in PT. (MET 01/25/24    - Patient will report reduced radicular symptoms by 40% to aid in house ADLs. (Progressing 01/25/24)    - Patient will exhibit functional trunk mobility and strength to allow non-limiting bed mobility activities due to pain.         Long-Term Goals: 6 Weeks     - Patient will demonstrate improved B side lying hip abduction strength of at least 3+/5 to aid in functional transfers.     - Patient will demonstrate 10 squats/sit <> stands with 10# to aid in independent completion of ADLs/IADLs.     - Patient will demonstrate improved functional ability via improved Patient Specific Functional Score to at least 6.     - Patient will report abolishment of radicular symptoms with worst subjective pain <=5/10 to allow return to community ambulation not limited by pain.            Plan: will continue and progress as tolerated     Total Session Time 43 and Timed code minutes 43  THERAPEUTIC EXERCISE 43 minutes      Kegan Shepardson, PT  01/27/2024, 13:48

## 2024-02-03 ENCOUNTER — Ambulatory Visit (HOSPITAL_COMMUNITY): Payer: Self-pay

## 2024-02-05 ENCOUNTER — Ambulatory Visit (HOSPITAL_COMMUNITY): Payer: Self-pay

## 2024-02-10 ENCOUNTER — Other Ambulatory Visit: Payer: Self-pay

## 2024-02-10 ENCOUNTER — Ambulatory Visit (HOSPITAL_COMMUNITY)
Admission: RE | Admit: 2024-02-10 | Discharge: 2024-02-10 | Disposition: A | Discharge status: Still In Hospital | Payer: Self-pay | Source: Ambulatory Visit

## 2024-02-10 NOTE — PT Treatment (Signed)
 [facility] Medicine [facility]  Outpatient Physical Therapy  [address]  [place], [address]  [phone]  (Fax) [phone]    Physical Therapy Treatment Note    Date: 02/10/2024  Patient's Name: Todd MENDIOLA Sr.  Date of Birth: 1946/05/29  Physical Therapy Visit    Visit #/POC: 8 of 12  Authorization: 8 of 12  POC Signed?:   POC Ends: 02/16/24  Order Ends: 02/16/24  Next Progress Note Due: 8-10th visit        Evaluating Physical Therapist: Sheryl Donna, PT, DPT  PT diagnosis/Reason for Referral: lumbar radiculopathy  Next Scheduled Physician Appointment: 03/15/24  Allergies/Contraindications: n/a        Subjective:  Reports that the side of his L LE feels like it is cramping but reports not really having pain.     Objective:  Activities as noted below.       Patient-Specific Functional Score:     Problem Score  01/25/24   1. House ADLs 0  5   2. Standing to finish 0  4   3. Stooping down 0  9   Total 0   6   Total score = sum of the activity scores/number of activities    Minimal detectable change (90% CI) for avg score = 2 points    Minimal detectable change (90% CI) for single activity score = 3 points              EXERCISE/ACTIVITY NAME REPETITIONS RESISTANCE COMPLETED THIS DOS   Nustep    7 minutes L4 Y   Hooklying hip abd L/R   Hook lying march 12 each  12 each Green  Green N  N    Manual HS stretch    60 seconds   n   SKC with ball    10   N    LTR with ball    10   n   Bridges in hip abduction   15 Green Y   Side lying clamshell (B)   2x15 each Green Y   Side lying hip abduction (B) 10 Green Y   Supine hamstring stretch (B)  Supine hamstring mobilization via popliteal angle 2 x 30"  12 each Manual  AROM Y  Y   Hip flexor stretch      Figure-4 stretch      Standing trunk extension 60 seconds x 2     2x30"      10 x 5 sec   at step      Seated  n  n     N    Seated hip IR    20 Yellow Y    Back extension machine     2 x 12 40# Y    Hip abduction machine    15 40# N   Side stepping   3 trips  in // bar Green at ankles Y   Standing hip extension 10 each alternating Green at ankles Y   Leg press machine 15 70# n   MHP 10 minutes Lumbar spine n      DISCONTINUED ACTIVITIES                                                         Assessment: Pt tolerates more progressive strengthening, did  note that he had some  L groin pain into posterior hip with resisted side stepping though did not feel like he needed to stop, all other exercises were tolerated well with LE weakness appreciated.   hort-Term Goals: 3 Weeks     - Patient will demonstrate improved lumbar AROM to at least 15 trunk extension to aid in completion of ADLs.     - Patient will be independent with progressive HEP to maximize gains from PT. (MET 01/25/24)    - Patient will report max 7 LBP to aid in participation in PT. (MET 01/25/24    - Patient will report reduced radicular symptoms by 40% to aid in house ADLs. (Progressing 01/25/24)    - Patient will exhibit functional trunk mobility and strength to allow non-limiting bed mobility activities due to pain.         Long-Term Goals: 6 Weeks     - Patient will demonstrate improved B side lying hip abduction strength of at least 3+/5 to aid in functional transfers.     - Patient will demonstrate 10 squats/sit <> stands with 10# to aid in independent completion of ADLs/IADLs.     - Patient will demonstrate improved functional ability via improved Patient Specific Functional Score to at least 6.     - Patient will report abolishment of radicular symptoms with worst subjective pain <=5/10 to allow return to community ambulation not limited by pain.         Plan: will continue and progress as tolerated     Total Session Time 43 and Timed code minutes 43  THERAPEUTIC EXERCISE 43 minutes    Lamyra Malcolm, PTA 02/10/2024 14:30

## 2024-02-12 ENCOUNTER — Ambulatory Visit
Admission: RE | Admit: 2024-02-12 | Discharge: 2024-02-12 | Disposition: A | Discharge status: Still In Hospital | Payer: Self-pay | Source: Ambulatory Visit

## 2024-02-12 ENCOUNTER — Other Ambulatory Visit: Payer: Self-pay

## 2024-02-12 NOTE — PT Treatment (Signed)
 [facility] Medicine [facility]  Outpatient Physical Therapy  [address]  [place], [address]  [phone]  (Fax) [phone]    Physical Therapy Discharge Note    Date: 02/12/2024  Patient's Name: Todd SPERL Sr.  Date of Birth: 24-Jun-1946  Physical Therapy Discharge          Visit #/POC: 8 of 12  Authorization: 8 of 12  POC Signed?:   POC Ends: 02/16/24  Order Ends: 02/16/24  Next Progress Note Due: 8-10th visit        Evaluating Physical Therapist: Sheryl Donna, PT, DPT  PT diagnosis/Reason for Referral: lumbar radiculopathy  Next Scheduled Physician Appointment: 03/15/24  Allergies/Contraindications: n/a        Subjective:  Patient reports 35% improvement with PT thus far.  Patient relates improvement to being able to walk and stand longer to due ADLs compared to at initial evaluation.  Pain level remains unchanged.  Still with same radicular symptoms in both glutes and hamstrings when standing or walking.       Objective:  Activities as noted below.      AROM    right left   Flexion 65     Extension 10 pain     Sidebend 20 20   Rotation 20 20      ROM comments Extension reproduced pain  Hip IR bilaterally at 15 degrees, ER at 30 degrees     Strength (Manual Muscle Testing per [employer] Muscle Grading system)  Seated hip flexion: 4/5 bilaterally  Side lying hip abduction: 2+/5  Side lying clamshells: 3-/5  Bridges: approximately 50% of normal AROM with reproduction of hamstring symptoms     Patient-Specific Functional Score:     Problem Score  01/25/24   1. House ADLs 0  5   2. Standing to finish 0  4   3. Stooping down 0  9   Total 0   6   Total score = sum of the activity scores/number of activities    Minimal detectable change (90% CI) for avg score = 2 points    Minimal detectable change (90% CI) for single activity score = 3 points              EXERCISE/ACTIVITY NAME REPETITIONS RESISTANCE COMPLETED THIS DOS   Nustep    7 minutes L4 Y   Hooklying hip abd L/R   Hook lying march 12 each  12  each Green  Green N  N    Manual HS stretch    60 seconds   n   SKC with ball    10   N    LTR with ball    10   n   Bridges in hip abduction    15 Green Y   Side lying clamshell (B)    2x15 each Green Y   Side lying hip abduction (B) 10 Green Y   Supine hamstring stretch (B)  Supine hamstring mobilization via popliteal angle 2 x 30"  12 each Manual  AROM Y  Y   Hip flexor stretch      Figure-4 stretch      Standing trunk extension 60 seconds x 2     2x30"      10 x 5 sec   at step      Seated  n  n     N    Seated hip IR    20 Yellow Y    Back extension machine  2 x 12 40# Y    Hip abduction machine    15 40# N   Side stepping    3 trips in // bar Green at ankles Y   Standing hip extension 10 each alternating Green at ankles Y   Leg press machine 15 70# n   MHP 10 minutes Lumbar spine n      DISCONTINUED ACTIVITIES                                                      Access Code: [email or web address]  URL: [email or web address]  Date: 02/12/2024  Prepared by: Fabio Holts Meztli Llanas    Exercises  - Supine 90/90 Sciatic Nerve Glide with Knee Flexion/Extension  - 1 x daily - 7 x weekly - 1 sets - 15 reps  - Hooklying Single Knee to Chest Stretch  - 1 x daily - 7 x weekly - 1 sets - 15 reps  - Supine Figure 4 Piriformis Stretch  - 1 x daily - 7 x weekly - 1 sets - 2 reps - 30 seconds hold  - Supine Lower Trunk Rotation  - 1 x daily - 7 x weekly - 1 sets - 15 reps  - Supine Transversus Abdominis Bracing - Hands on Thighs  - 1 x daily - 7 x weekly - 1 sets - 12 reps - 3 second hold  - Side Stepping with Resistance at Thighs and Counter Support  - 1 x daily - 4 x weekly - 2 sets - 10 reps  - Forward Backward Monster Walk with Band at Thighs and Counter Support  - 1 x daily - 4 x weekly - 2 sets - 10 reps  - Backward Monster Walk with Resistance at [employer] and Counter Support  - 1 x daily - 4 x weekly - 2 sets - 10 reps     Assessment: Patient has made minimal objective improvements since start of POC.  Still exhibits limited trunk  and hip mobility and significantly weak hip abductors.  Continues with unchanged pain intensity with standing and walking tasks.  Based on minimal improvement, I am recommending patient follow up with referring physician for further assessment.  Interventions included therex and modalities for the treatment of lumbar radiculopathy.     hort-Term Goals: 3 Weeks     - Patient will demonstrate improved lumbar AROM to at least 15 trunk extension to aid in completion of ADLs. (Not met 5/30)    - Patient will be independent with progressive HEP to maximize gains from PT. (MET 01/25/24)    - Patient will report max 7 LBP to aid in participation in PT. (MET 01/25/24)    - Patient will report reduced radicular symptoms by 40% to aid in house ADLs. (Progressing 01/25/24)    - Patient will exhibit functional trunk mobility and strength to allow non-limiting bed mobility activities due to pain. (Not met 5/30)        Long-Term Goals: 6 Weeks     - Patient will demonstrate improved B side lying hip abduction strength of at least 3+/5 to aid in functional transfers. (Not met 5/30)    - Patient will demonstrate 10 squats/sit <> stands with 10# to aid in independent completion of ADLs/IADLs. (Not met 5/30)    - Patient will demonstrate improved functional ability via improved Patient  Specific Functional Score to at least 6.(Not met 5/30)     - Patient will report abolishment of radicular symptoms with worst subjective pain <=5/10 to allow return to community ambulation not limited by pain. (Not met 5/30)           Plan: Discharged and referred back to physician for further assessment.    Total Session Time 28 and Timed code minutes 28  THERAPEUTIC EXERCISE 28 minutes      Albina Gosney, PT  02/12/2024, 12:48
